# Patient Record
Sex: Female | Born: 1985 | Race: White | Hispanic: No | Marital: Single | Smoking: Never smoker
Health system: Southern US, Community
[De-identification: ages and names within clinical notes are randomized; demographics above are authoritative.]

---

## 2015-11-12 ENCOUNTER — Emergency Department (HOSPITAL_COMMUNITY)
Admission: EM | Admit: 2015-11-12 | Discharge: 2015-11-13 | Disposition: A | Discharge status: Home / Self Care | Payer: 59 | Attending: Emergency Medicine | Admitting: Emergency Medicine

## 2015-11-12 ENCOUNTER — Encounter (HOSPITAL_COMMUNITY): Payer: Self-pay | Admitting: Emergency Medicine

## 2015-11-12 ENCOUNTER — Emergency Department (HOSPITAL_COMMUNITY): Payer: 59

## 2015-11-12 DIAGNOSIS — R0789 Other chest pain: Secondary | ICD-10-CM | POA: Insufficient documentation

## 2015-11-12 DIAGNOSIS — R1013 Epigastric pain: Secondary | ICD-10-CM | POA: Diagnosis not present

## 2015-11-12 DIAGNOSIS — R079 Chest pain, unspecified: Secondary | ICD-10-CM | POA: Diagnosis present

## 2015-11-12 DIAGNOSIS — Z79899 Other long term (current) drug therapy: Secondary | ICD-10-CM | POA: Diagnosis not present

## 2015-11-12 LAB — CBC
HEMATOCRIT: 44.8 % (ref 36.0–46.0)
HEMOGLOBIN: 14.8 g/dL (ref 12.0–15.0)
MCH: 30.3 pg (ref 26.0–34.0)
MCHC: 33 g/dL (ref 30.0–36.0)
MCV: 91.6 fL (ref 78.0–100.0)
Platelets: 334 10*3/uL (ref 150–400)
RBC: 4.89 MIL/uL (ref 3.87–5.11)
RDW: 12.4 % (ref 11.5–15.5)
WBC: 11.1 10*3/uL — AB (ref 4.0–10.5)

## 2015-11-12 LAB — BASIC METABOLIC PANEL
ANION GAP: 12 (ref 5–15)
BUN: 11 mg/dL (ref 6–20)
CHLORIDE: 102 mmol/L (ref 101–111)
CO2: 24 mmol/L (ref 22–32)
Calcium: 9.5 mg/dL (ref 8.9–10.3)
Creatinine, Ser: 0.94 mg/dL (ref 0.44–1.00)
GFR calc non Af Amer: >60 mL/min (ref >60)
Glucose, Bld: 119 mg/dL — ABNORMAL HIGH (ref 65–99)
POTASSIUM: 3.9 mmol/L (ref 3.5–5.1)
SODIUM: 138 mmol/L (ref 135–145)

## 2015-11-12 LAB — I-STAT TROPONIN, ED: Troponin i, poc: 0 ng/mL (ref 0.00–0.08)

## 2015-11-12 MED ORDER — OMEPRAZOLE 20 MG PO CPDR: 20.0000 mg | DELAYED_RELEASE_CAPSULE | Freq: Every day | ORAL | Status: AC

## 2015-11-12 NOTE — ED Notes (Signed)
Ben, PA at bedside at this time.  

## 2015-11-12 NOTE — ED Notes (Signed)
Pt states 20 minutes after eating tonight she got a tight feeling in the center of her chest that radiatied into her upper back. Pt denies any pain at this time. Pt took 3  ASA.

## 2015-11-12 NOTE — Discharge Instructions (Signed)
Your exam was reassuring as were your labs, EKG, chest x-ray. Please take your medications as prescribed. Follow-up with your doctor for reevaluation in the next 2 or 3 days. Return to ED for any new or worsening symptoms as discussed.  Nonspecific Chest Pain  Chest pain can be caused by many different conditions. There is always a chance that your pain could be related to something serious, such as a heart attack or a blood clot in your lungs. Chest pain can also be caused by conditions that are not life-threatening. If you have chest pain, it is very important to follow up with your health care provider. CAUSES  Chest pain can be caused by:  Heartburn.  Pneumonia or bronchitis.  Anxiety or stress.  Inflammation around your heart (pericarditis) or lung (pleuritis or pleurisy).  A blood clot in your lung.  A collapsed lung (pneumothorax). It can develop suddenly on its own (spontaneous pneumothorax) or from trauma to the chest.  Shingles infection (varicella-zoster virus).  Heart attack.  Damage to the bones, muscles, and cartilage that make up your chest wall. This can include:  Bruised bones due to injury.  Strained muscles or cartilage due to frequent or repeated coughing or overwork.  Fracture to one or more ribs.  Sore cartilage due to inflammation (costochondritis). RISK FACTORS  Risk factors for chest pain may include:  Activities that increase your risk for trauma or injury to your chest.  Respiratory infections or conditions that cause frequent coughing.  Medical conditions or overeating that can cause heartburn.  Heart disease or family history of heart disease.  Conditions or health behaviors that increase your risk of developing a blood clot.  Having had chicken pox (varicella zoster). SIGNS AND SYMPTOMS Chest pain can feel like:  Burning or tingling on the surface of your chest or deep in your chest.  Crushing, pressure, aching, or squeezing  pain.  Dull or sharp pain that is worse when you move, cough, or take a deep breath.  Pain that is also felt in your back, neck, shoulder, or arm, or pain that spreads to any of these areas. Your chest pain may come and go, or it may stay constant. DIAGNOSIS Lab tests or other studies may be needed to find the cause of your pain. Your health care provider may have you take a test called an ambulatory ECG (electrocardiogram). An ECG records your heartbeat patterns at the time the test is performed. You may also have other tests, such as:  Transthoracic echocardiogram (TTE). During echocardiography, sound waves are used to create a picture of all of the heart structures and to look at how blood flows through your heart.  Transesophageal echocardiogram (TEE).This is a more advanced imaging test that obtains images from inside your body. It allows your health care provider to see your heart in finer detail.  Cardiac monitoring. This allows your health care provider to monitor your heart rate and rhythm in real time.  Holter monitor. This is a portable device that records your heartbeat and can help to diagnose abnormal heartbeats. It allows your health care provider to track your heart activity for several days, if needed.  Stress tests. These can be done through exercise or by taking medicine that makes your heart beat more quickly.  Blood tests.  Imaging tests. TREATMENT  Your treatment depends on what is causing your chest pain. Treatment may include:  Medicines. These may include:  Acid blockers for heartburn.  Anti-inflammatory medicine.  Pain medicine  for inflammatory conditions.  Antibiotic medicine, if an infection is present.  Medicines to dissolve blood clots.  Medicines to treat coronary artery disease.  Supportive care for conditions that do not require medicines. This may include:  Resting.  Applying heat or cold packs to injured areas.  Limiting activities  until pain decreases. HOME CARE INSTRUCTIONS  If you were prescribed an antibiotic medicine, finish it all even if you start to feel better.  Avoid any activities that bring on chest pain.  Do not use any tobacco products, including cigarettes, chewing tobacco, or electronic cigarettes. If you need help quitting, ask your health care provider.  Do not drink alcohol.  Take medicines only as directed by your health care provider.  Keep all follow-up visits as directed by your health care provider. This is important. This includes any further testing if your chest pain does not go away.  If heartburn is the cause for your chest pain, you may be told to keep your head raised (elevated) while sleeping. This reduces the chance that acid will go from your stomach into your esophagus.  Make lifestyle changes as directed by your health care provider. These may include:  Getting regular exercise. Ask your health care provider to suggest some activities that are safe for you.  Eating a heart-healthy diet. A registered dietitian can help you to learn healthy eating options.  Maintaining a healthy weight.  Managing diabetes, if necessary.  Reducing stress. SEEK MEDICAL CARE IF:  Your chest pain does not go away after treatment.  You have a rash with blisters on your chest.  You have a fever. SEEK IMMEDIATE MEDICAL CARE IF:   Your chest pain is worse.  You have an increasing cough, or you cough up blood.  You have severe abdominal pain.  You have severe weakness.  You faint.  You have chills.  You have sudden, unexplained chest discomfort.  You have sudden, unexplained discomfort in your arms, back, neck, or jaw.  You have shortness of breath at any time.  You suddenly start to sweat, or your skin gets clammy.  You feel nauseous or you vomit.  You suddenly feel light-headed or dizzy.  Your heart begins to beat quickly, or it feels like it is skipping beats. These  symptoms may represent a serious problem that is an emergency. Do not wait to see if the symptoms will go away. Get medical help right away. Call your local emergency services (911 in the [place] Do not drive yourself to the hospital.   This information is not intended to replace advice given to you by your health care provider. Make sure you discuss any questions you have with your health care provider.   Document Released: 06/29/2005 Document Revised: 10/10/2014 Document Reviewed: 04/25/2014 Elsevier Interactive Patient Education [employer]

## 2015-11-12 NOTE — ED Provider Notes (Signed)
CSN: [id number]     Arrival date & time 11/12/15  2131 History   First MD Initiated Contact with Patient 11/12/15 2248     Chief Complaint  Patient presents with  . Chest Pain     (Consider location/radiation/quality/duration/timing/severity/associated sxs/prior Treatment) HPI Cindy Warner is a 30 y.o. female who comes in for evaluation of epigastric discomfort. Patient reports over the past 2 weeks, after eating and she feels a sharp/burning sensation in her epigastric region. She is taking Gas-X with relief of her symptoms. However, tonight after eating some and soda she began to experience the same symptoms, more intensely. She denies any associated nausea, vomiting, diaphoresis, radiation of pain, fevers or chills. No other shortness of breath, cough, abdominal pain, urinary symptoms, diarrhea or constipation, numbness or weakness.  PERC negative  History reviewed. No pertinent past medical history. History reviewed. No pertinent past surgical history. No family history on file. Social History  Substance Use Topics  . Smoking status: Never Smoker   . Smokeless tobacco: None  . Alcohol Use: No   OB History    No data available     Review of Systems A 10 point review of systems was completed and was negative except for pertinent positives and negatives as mentioned in the history of present illness     Allergies  Review of patient's allergies indicates no known allergies.  Home Medications   Prior to Admission medications   Medication Sig Start Date End Date Taking? Authorizing Provider  famotidine (PEPCID) 20 MG tablet Take 20 mg by mouth daily.   Yes Historical Provider, MD  omeprazole (PRILOSEC) 20 MG capsule Take 1 capsule (20 mg total) by mouth daily. 11/12/15   Joycie Peek, PA-C   BP 119/80 mmHg  Pulse 78  Temp(Src) 98.2 F (36.8 C) (Oral)  Resp 17  Ht  (1.651 m)  Wt 117.935 kg  BMI 43.27 kg/m2  SpO2 99%  LMP 11/01/2015 (Exact Date) Physical Exam   Constitutional: She is oriented to person, place, and time. She appears well-developed and well-nourished.  HENT:  Head: Normocephalic and atraumatic.  Mouth/Throat: Oropharynx is clear and moist.  Eyes: Conjunctivae are normal. Pupils are equal, round, and reactive to light. Right eye exhibits no discharge. Left eye exhibits no discharge. No scleral icterus.  Neck: Normal range of motion. Neck supple.  Cardiovascular: Normal rate, regular rhythm and normal heart sounds.   Pulmonary/Chest: Effort normal and breath sounds normal. No respiratory distress. She has no wheezes. She has no rales.  Abdominal: Soft.  Mild discomfort with deep palpation of the epigastric region. Abdomen is otherwise soft, nondistended without rebound or guarding and no other abnormalities  Musculoskeletal: She exhibits no tenderness.  Neurological: She is alert and oriented to person, place, and time.  Cranial Nerves II-XII grossly intact  Skin: Skin is warm and dry. No rash noted.  Psychiatric: She has a normal mood and affect.  Nursing note and vitals reviewed.   ED Course  Procedures (including critical care time) Labs Review Labs Reviewed  BASIC METABOLIC PANEL - Abnormal; Notable for the following:    Glucose, Bld 119 (*)    All other components within normal limits  CBC - Abnormal; Notable for the following:    WBC 11.1 (*)    All other components within normal limits  Rosezena Sensor, ED    Imaging Review Dg Chest 2 View  11/12/2015  CLINICAL DATA:  Chest pain.  Abdominal pain. EXAM: CHEST  2 VIEW COMPARISON:  None. FINDINGS: The heart size and mediastinal contours are within normal limits. Both lungs are clear. The visualized skeletal structures are unremarkable. IMPRESSION: No active cardiopulmonary disease. Electronically Signed   By: Elsie Stain M.D.   On: 11/12/2015 22:11   I have personally reviewed and evaluated these images and lab results as part of my medical decision-making.   EKG  Interpretation None     Meds given in ED:  Medications - No data to display  Discharge Medication List as of 11/12/2015 11:30 PM    START taking these medications   Details  omeprazole (PRILOSEC) 20 MG capsule Take 1 capsule (20 mg total) by mouth daily., Starting 11/12/2015, Until Discontinued, Print       Filed Vitals:   11/12/15 2144 11/12/15 2330  BP: 144/99 119/80  Pulse: 90 78  Temp: 98.2 F (36.8 C)   TempSrc: Oral   Resp: 16 17  Height:  (1.651 m)   Weight: 117.935 kg   SpO2: 100% 99%    MDM  Semaj Kham is a 30 y.o. female who presents with atypical chest pain. Likely GI related. Story and presentation not consistent with ACS. EKG is reassuring. Chest x-ray unremarkable. Labs unremarkable. Troponin negative. Heart score 0. PERC negative. Plan to DC with PPI, symptomatically therapy. Follow-up with PCP. No evidence of other acute emergent pathology. Discuss results and ED course with patient and she verbalizes understanding and agrees with this plan as well as subsequent follow-up PCP this week for reevaluation. Stable for discharge. Final diagnoses:  Atypical chest pain        Joycie Peek, PA-C 11/13/15 0021  Laurence Spates, MD 11/13/15 [phone]

## 2017-08-04 IMAGING — DX DG CHEST 2V
2 series · 2 of 2 positions shown · non-contrast
Comparison: None.

CLINICAL DATA: Chest pain.  Abdominal pain.

EXAM:
CHEST  2 VIEW

[w chest pa]
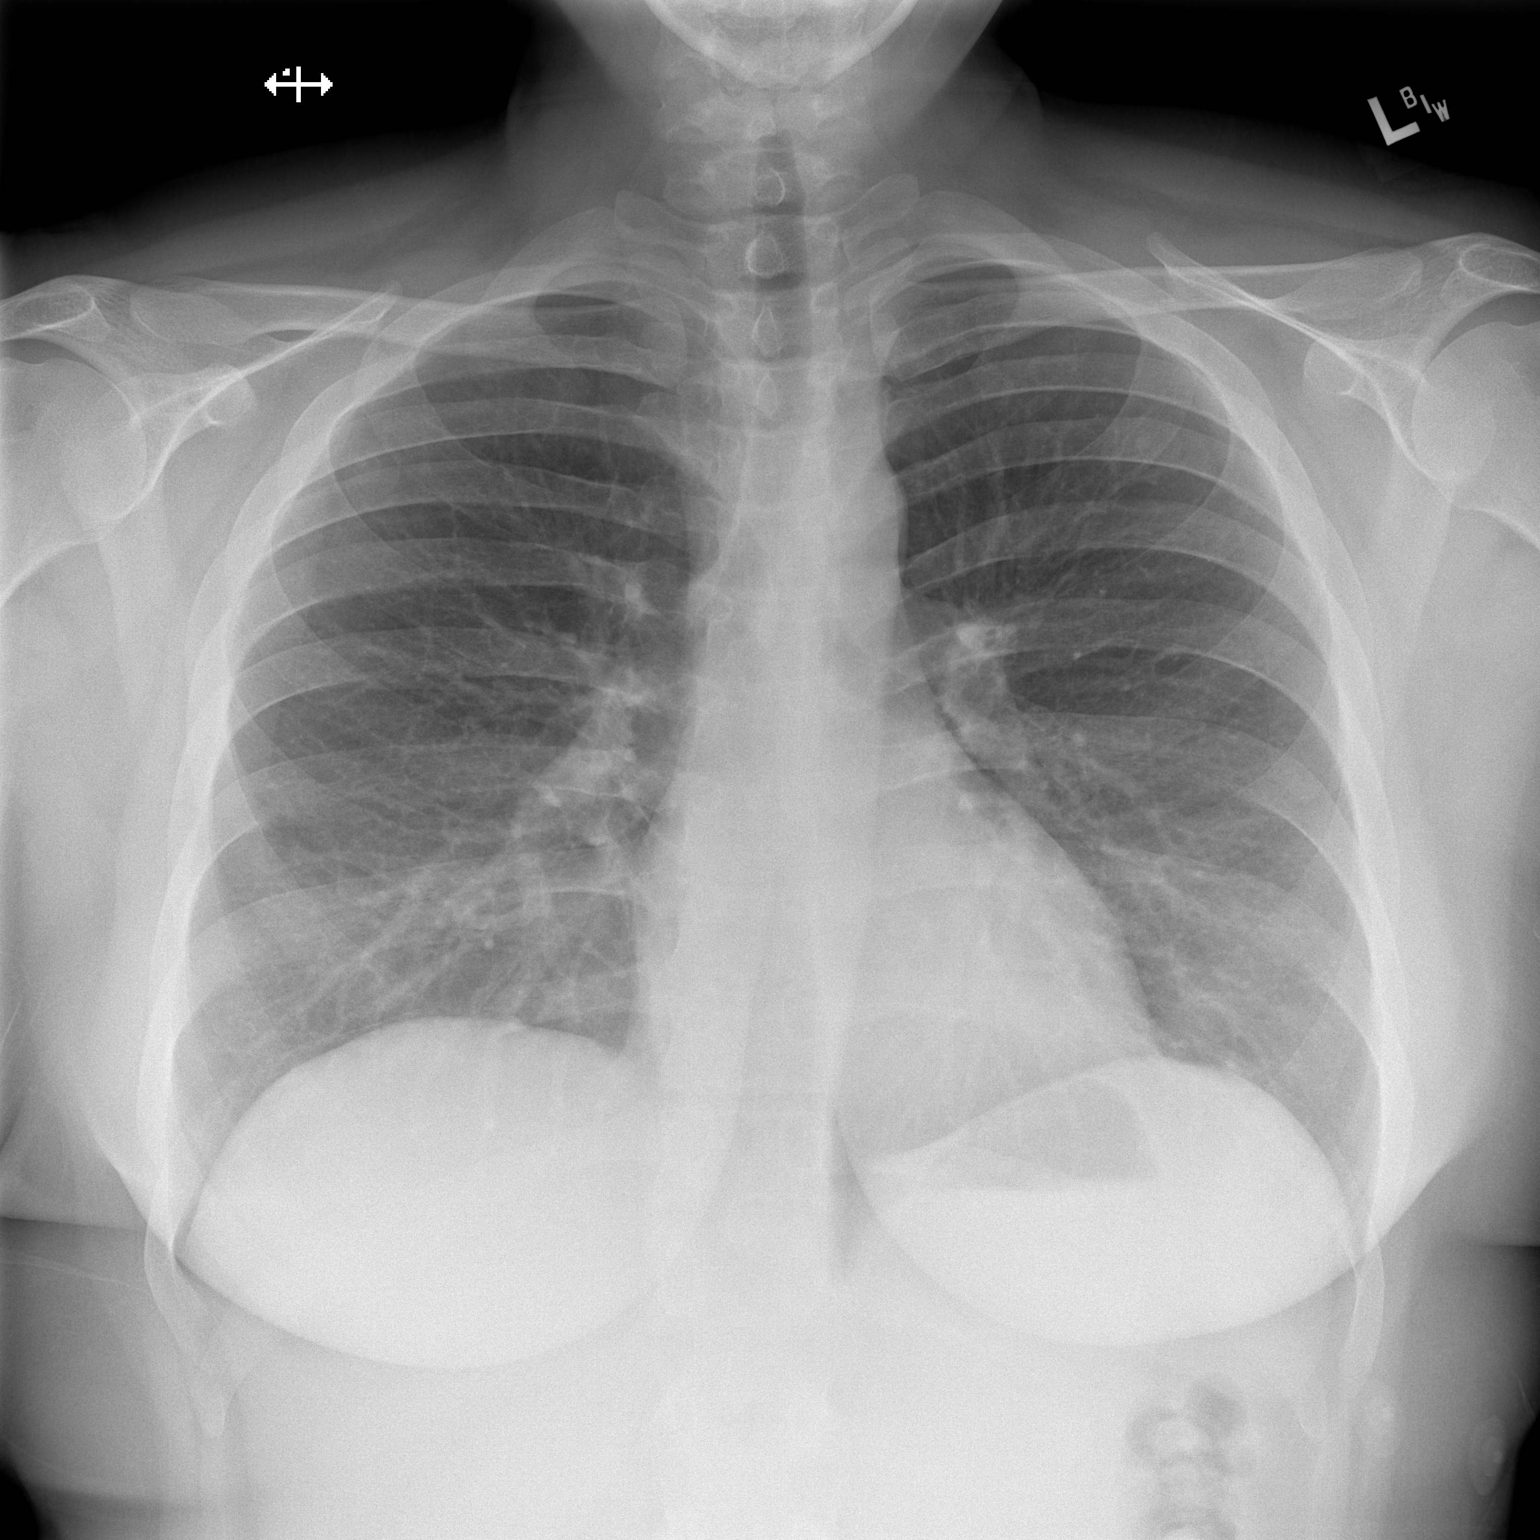

[w chest lat]
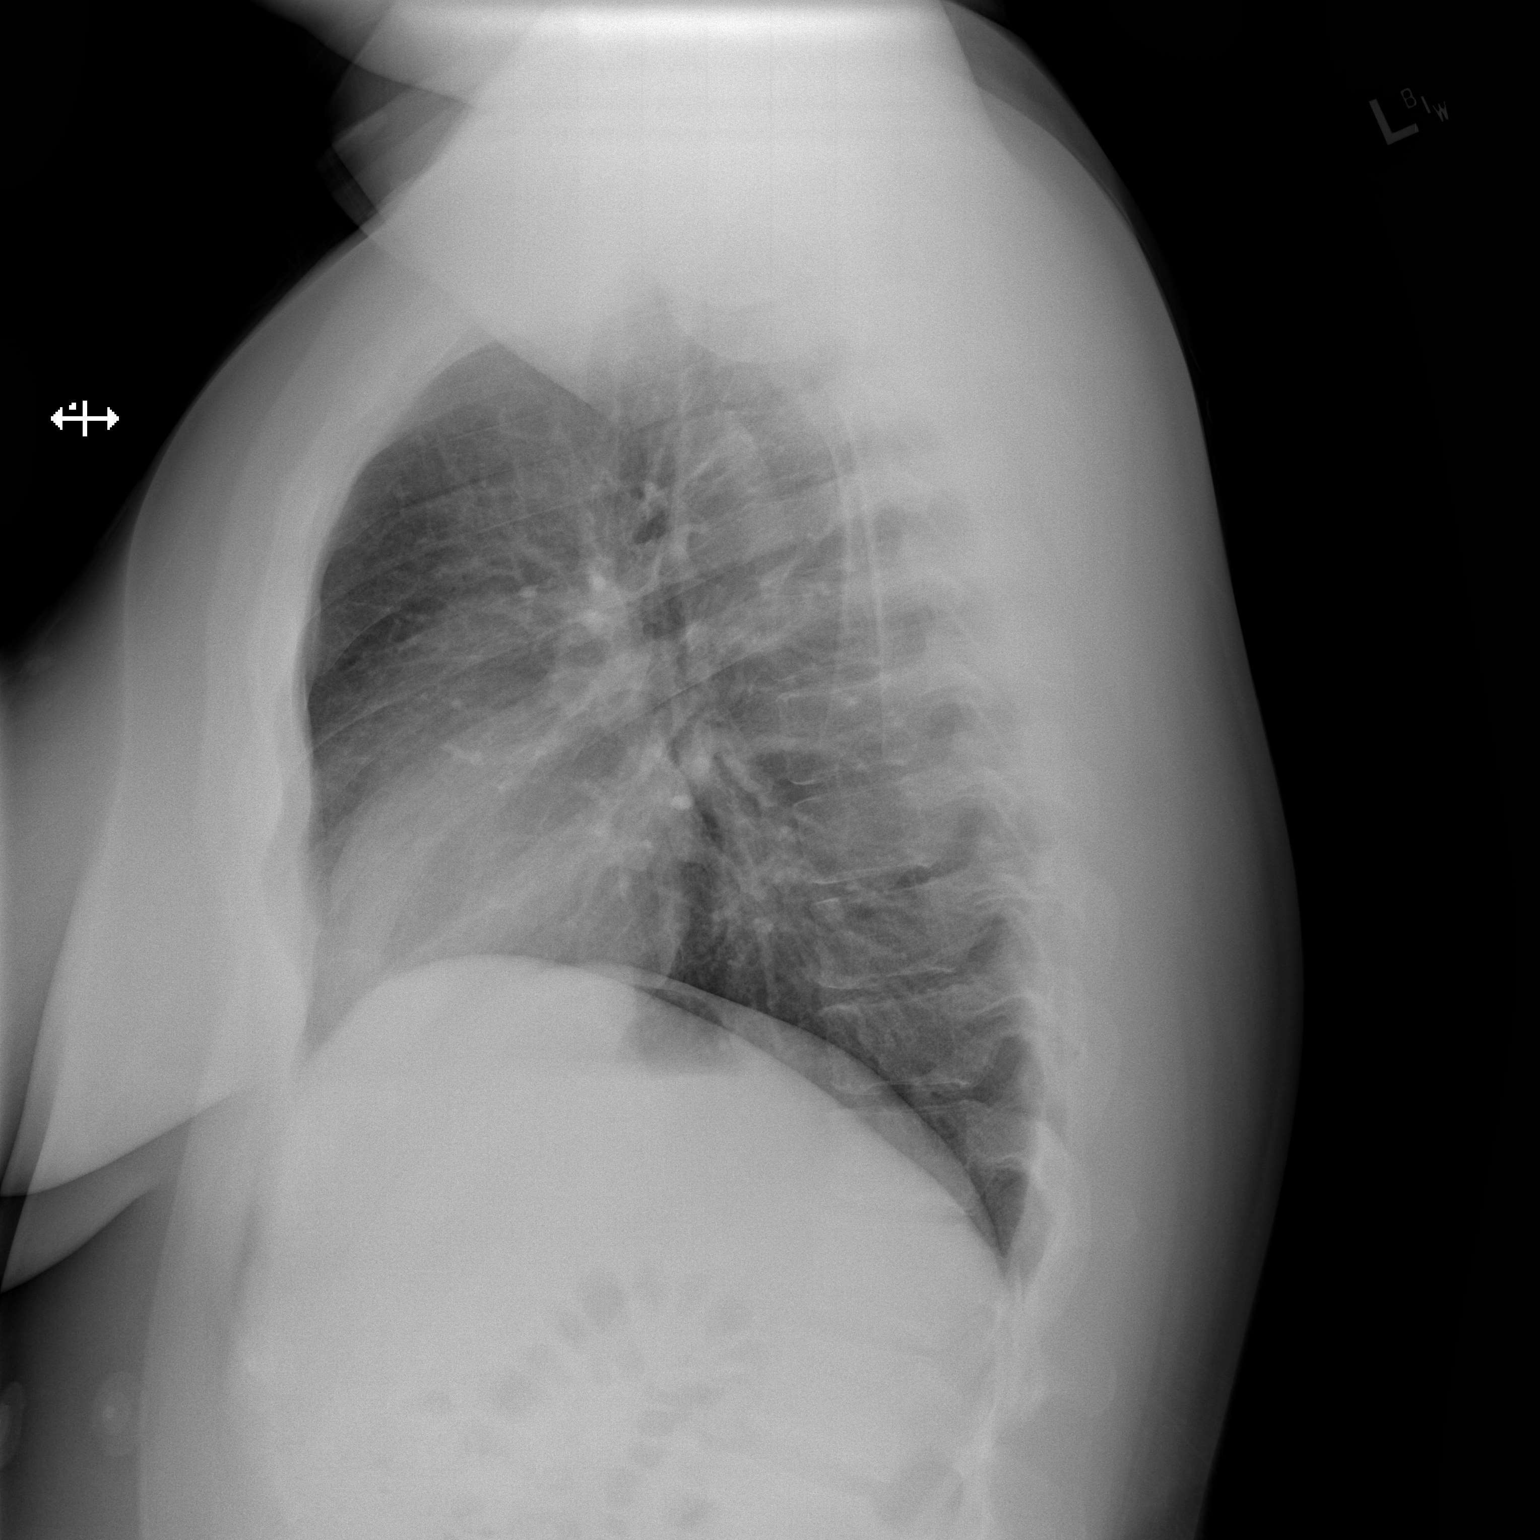

[2 of 2 positions shown; findings below may reference images not displayed]

FINDINGS: The heart size and mediastinal contours are within normal limits.
Both lungs are clear. The visualized skeletal structures are
unremarkable.
IMPRESSION: No active cardiopulmonary disease.
